# Patient Record
Sex: Female | Born: 1985 | Race: White | Hispanic: No | Smoking: Current every day smoker
Health system: Southern US, Community
[De-identification: ages and names within clinical notes are randomized; demographics above are authoritative.]

---

## 2004-08-22 ENCOUNTER — Emergency Department: Payer: Self-pay | Admitting: Emergency Medicine

## 2004-08-24 ENCOUNTER — Ambulatory Visit: Payer: Self-pay | Admitting: Obstetrics and Gynecology

## 2004-08-27 ENCOUNTER — Emergency Department: Payer: Self-pay | Admitting: Emergency Medicine

## 2004-10-21 ENCOUNTER — Other Ambulatory Visit: Payer: Self-pay

## 2004-10-21 ENCOUNTER — Ambulatory Visit: Payer: Self-pay | Admitting: Specialist

## 2005-04-16 ENCOUNTER — Inpatient Hospital Stay: Payer: Self-pay | Admitting: Obstetrics and Gynecology

## 2006-03-28 ENCOUNTER — Ambulatory Visit: Payer: Self-pay | Admitting: Family Medicine

## 2006-05-11 ENCOUNTER — Ambulatory Visit: Payer: Self-pay | Admitting: Family Medicine

## 2006-09-02 ENCOUNTER — Observation Stay: Payer: Self-pay | Admitting: Obstetrics and Gynecology

## 2006-09-10 ENCOUNTER — Observation Stay: Payer: Self-pay | Admitting: Obstetrics and Gynecology

## 2006-09-22 ENCOUNTER — Inpatient Hospital Stay: Payer: Self-pay | Admitting: Obstetrics and Gynecology

## 2008-06-08 ENCOUNTER — Emergency Department: Payer: Self-pay | Admitting: Emergency Medicine

## 2008-09-25 ENCOUNTER — Ambulatory Visit: Payer: Self-pay

## 2009-05-20 ENCOUNTER — Emergency Department: Payer: Self-pay | Admitting: Emergency Medicine

## 2009-08-16 ENCOUNTER — Ambulatory Visit: Payer: Self-pay | Admitting: Family Medicine

## 2009-11-12 ENCOUNTER — Observation Stay: Payer: Self-pay | Admitting: Obstetrics and Gynecology

## 2009-12-03 ENCOUNTER — Observation Stay: Payer: Self-pay | Admitting: Obstetrics and Gynecology

## 2009-12-28 ENCOUNTER — Inpatient Hospital Stay: Payer: Self-pay

## 2010-08-14 ENCOUNTER — Emergency Department: Payer: Self-pay | Admitting: Emergency Medicine

## 2010-08-29 ENCOUNTER — Ambulatory Visit: Payer: Self-pay | Admitting: Obstetrics and Gynecology

## 2010-09-01 ENCOUNTER — Ambulatory Visit (HOSPITAL_COMMUNITY): Admission: RE | Admit: 2010-09-01 | Discharge: 2010-09-01 | Payer: Self-pay | Admitting: Obstetrics and Gynecology

## 2010-09-15 ENCOUNTER — Other Ambulatory Visit: Admission: RE | Admit: 2010-09-15 | Discharge: 2010-09-15 | Payer: Self-pay | Admitting: Obstetrics & Gynecology

## 2010-09-15 ENCOUNTER — Ambulatory Visit: Payer: Self-pay | Admitting: Obstetrics & Gynecology

## 2010-09-16 ENCOUNTER — Encounter: Payer: Self-pay | Admitting: Obstetrics & Gynecology

## 2010-10-13 ENCOUNTER — Ambulatory Visit: Payer: Self-pay | Admitting: Obstetrics and Gynecology

## 2010-10-31 ENCOUNTER — Encounter: Payer: Self-pay | Admitting: Obstetrics and Gynecology

## 2010-10-31 ENCOUNTER — Ambulatory Visit: Payer: Self-pay | Admitting: Obstetrics and Gynecology

## 2010-10-31 LAB — CONVERTED CEMR LAB
HCT: 33.7 % — ABNORMAL LOW (ref 36.0–46.0)
HIV: NONREACTIVE
Hemoglobin: 11.2 g/dL — ABNORMAL LOW (ref 12.0–15.0)
MCHC: 33.2 g/dL (ref 30.0–36.0)
MCV: 87.5 fL (ref 78.0–100.0)
Platelets: 186 10*3/uL (ref 150–400)
RBC: 3.85 M/uL — ABNORMAL LOW (ref 3.87–5.11)
RDW: 14.3 % (ref 11.5–15.5)
WBC: 10.7 10*3/uL — ABNORMAL HIGH (ref 4.0–10.5)

## 2010-11-19 ENCOUNTER — Inpatient Hospital Stay (HOSPITAL_COMMUNITY)
Admission: AD | Admit: 2010-11-19 | Discharge: 2010-11-20 | Payer: Self-pay | Source: Home / Self Care | Attending: Obstetrics and Gynecology | Admitting: Obstetrics and Gynecology

## 2010-11-21 ENCOUNTER — Ambulatory Visit
Admission: RE | Admit: 2010-11-21 | Discharge: 2010-11-21 | Payer: Self-pay | Source: Home / Self Care | Attending: Obstetrics and Gynecology | Admitting: Obstetrics and Gynecology

## 2010-11-28 LAB — URINALYSIS, ROUTINE W REFLEX MICROSCOPIC
Bilirubin Urine: NEGATIVE
Hgb urine dipstick: NEGATIVE
Ketones, ur: >80 mg/dL — AB
Nitrite: NEGATIVE
Protein, ur: NEGATIVE mg/dL
Specific Gravity, Urine: >1.03 — ABNORMAL HIGH (ref 1.005–1.030)
Urine Glucose, Fasting: NEGATIVE mg/dL
Urobilinogen, UA: 1 mg/dL (ref 0.0–1.0)
pH: 6 (ref 5.0–8.0)

## 2010-12-13 ENCOUNTER — Ambulatory Visit: Admit: 2010-12-13 | Payer: Self-pay | Admitting: Obstetrics & Gynecology

## 2010-12-22 ENCOUNTER — Encounter: Payer: Self-pay | Admitting: Obstetrics & Gynecology

## 2010-12-22 DIAGNOSIS — Z3480 Encounter for supervision of other normal pregnancy, unspecified trimester: Secondary | ICD-10-CM

## 2010-12-29 ENCOUNTER — Encounter: Payer: Medicaid Other | Admitting: Obstetrics & Gynecology

## 2010-12-29 ENCOUNTER — Encounter: Payer: Self-pay | Admitting: Obstetrics and Gynecology

## 2010-12-29 DIAGNOSIS — Z3480 Encounter for supervision of other normal pregnancy, unspecified trimester: Secondary | ICD-10-CM

## 2010-12-29 LAB — CONVERTED CEMR LAB
Chlamydia, DNA Probe: NEGATIVE
GC Probe Amp, Genital: NEGATIVE

## 2011-01-04 ENCOUNTER — Encounter: Payer: Medicaid Other | Admitting: Obstetrics & Gynecology

## 2011-01-04 DIAGNOSIS — Z3480 Encounter for supervision of other normal pregnancy, unspecified trimester: Secondary | ICD-10-CM

## 2011-01-12 ENCOUNTER — Encounter: Payer: Medicaid Other | Admitting: Obstetrics & Gynecology

## 2011-01-12 DIAGNOSIS — Z3480 Encounter for supervision of other normal pregnancy, unspecified trimester: Secondary | ICD-10-CM

## 2011-01-18 ENCOUNTER — Observation Stay: Payer: Self-pay

## 2011-01-19 ENCOUNTER — Encounter: Payer: Medicaid Other | Admitting: Obstetrics & Gynecology

## 2011-01-19 DIAGNOSIS — Z3480 Encounter for supervision of other normal pregnancy, unspecified trimester: Secondary | ICD-10-CM

## 2011-01-24 ENCOUNTER — Inpatient Hospital Stay (HOSPITAL_COMMUNITY)
Admission: AD | Admit: 2011-01-24 | Discharge: 2011-01-26 | DRG: 775 | Disposition: A | Discharge status: Home / Self Care | Payer: Medicaid Other | Source: Ambulatory Visit | Attending: Obstetrics & Gynecology | Admitting: Obstetrics & Gynecology

## 2011-01-24 ENCOUNTER — Encounter: Payer: Medicaid Other | Admitting: Obstetrics and Gynecology

## 2011-01-24 LAB — CBC
HCT: 34.2 % — ABNORMAL LOW (ref 36.0–46.0)
Hemoglobin: 11.2 g/dL — ABNORMAL LOW (ref 12.0–15.0)
MCH: 28.3 pg (ref 26.0–34.0)
MCHC: 32.7 g/dL (ref 30.0–36.0)
MCV: 86.4 fL (ref 78.0–100.0)
Platelets: 168 10*3/uL (ref 150–400)
RBC: 3.96 MIL/uL (ref 3.87–5.11)
RDW: 14.7 % (ref 11.5–15.5)
WBC: 13.5 10*3/uL — ABNORMAL HIGH (ref 4.0–10.5)

## 2011-01-24 LAB — RPR: RPR Ser Ql: NONREACTIVE

## 2011-01-24 LAB — TYPE AND SCREEN
ABO/RH(D): O POS
Antibody Screen: NEGATIVE

## 2011-01-24 LAB — RUBELLA SCREEN: Rubella: 55.7 IU/mL — ABNORMAL HIGH

## 2011-01-24 LAB — ABO/RH: ABO/RH(D): O POS

## 2011-01-25 LAB — HEPATITIS B SURFACE ANTIGEN: Hepatitis B Surface Ag: NEGATIVE

## 2011-03-07 ENCOUNTER — Ambulatory Visit: Payer: Medicaid Other | Admitting: Obstetrics and Gynecology

## 2012-10-01 ENCOUNTER — Emergency Department: Payer: Self-pay | Admitting: Emergency Medicine

## 2012-10-01 LAB — COMPREHENSIVE METABOLIC PANEL
Albumin: 3.1 g/dL — ABNORMAL LOW (ref 3.4–5.0)
Alkaline Phosphatase: 48 U/L — ABNORMAL LOW (ref 50–136)
Anion Gap: 7 (ref 7–16)
BUN: 6 mg/dL — ABNORMAL LOW (ref 7–18)
Bilirubin,Total: 0.5 mg/dL (ref 0.2–1.0)
Calcium, Total: 8.5 mg/dL (ref 8.5–10.1)
Chloride: 107 mmol/L (ref 98–107)
Co2: 22 mmol/L (ref 21–32)
Creatinine: 0.75 mg/dL (ref 0.60–1.30)
EGFR (African American): >60
EGFR (Non-African Amer.): >60
Glucose: 93 mg/dL (ref 65–99)
Osmolality: 269 (ref 275–301)
Potassium: 3.9 mmol/L (ref 3.5–5.1)
SGOT(AST): 10 U/L — ABNORMAL LOW (ref 15–37)
SGPT (ALT): 13 U/L (ref 12–78)
Sodium: 136 mmol/L (ref 136–145)
Total Protein: 7.3 g/dL (ref 6.4–8.2)

## 2012-10-01 LAB — URINALYSIS, COMPLETE
Bilirubin,UR: NEGATIVE
Blood: NEGATIVE
Glucose,UR: NEGATIVE mg/dL (ref 0–75)
Nitrite: POSITIVE
Ph: 5 (ref 4.5–8.0)
Protein: 30
RBC,UR: 5 /HPF (ref 0–5)
Specific Gravity: 1.03 (ref 1.003–1.030)
Squamous Epithelial: 1
WBC UR: 22 /HPF (ref 0–5)

## 2012-10-01 LAB — CBC
HCT: 36.6 % (ref 35.0–47.0)
HGB: 12.3 g/dL (ref 12.0–16.0)
MCH: 29.9 pg (ref 26.0–34.0)
MCHC: 33.7 g/dL (ref 32.0–36.0)
MCV: 89 fL (ref 80–100)
Platelet: 230 10*3/uL (ref 150–440)
RBC: 4.12 10*6/uL (ref 3.80–5.20)
RDW: 14.1 % (ref 11.5–14.5)
WBC: 11.3 10*3/uL — ABNORMAL HIGH (ref 3.6–11.0)

## 2012-10-01 LAB — HCG, QUANTITATIVE, PREGNANCY: Beta Hcg, Quant.: 19967 m[IU]/mL — ABNORMAL HIGH

## 2012-10-01 LAB — LIPASE, BLOOD: Lipase: 82 U/L (ref 73–393)

## 2012-10-01 LAB — PREGNANCY, URINE: Pregnancy Test, Urine: NEGATIVE m[IU]/mL

## 2012-10-01 LAB — WET PREP, GENITAL

## 2012-10-03 LAB — URINE CULTURE

## 2013-01-02 ENCOUNTER — Observation Stay: Payer: Self-pay | Admitting: Obstetrics and Gynecology

## 2013-01-02 LAB — CBC WITH DIFFERENTIAL/PLATELET
Basophil #: 0.1 10*3/uL (ref 0.0–0.1)
Basophil %: 0.7 %
Eosinophil #: 0.1 10*3/uL (ref 0.0–0.7)
Eosinophil %: 1.5 %
HCT: 32.4 % — ABNORMAL LOW (ref 35.0–47.0)
HGB: 10.7 g/dL — ABNORMAL LOW (ref 12.0–16.0)
Lymphocyte #: 1 10*3/uL (ref 1.0–3.6)
Lymphocyte %: 11.1 %
MCH: 29.2 pg (ref 26.0–34.0)
MCHC: 33.1 g/dL (ref 32.0–36.0)
MCV: 88 fL (ref 80–100)
Monocyte #: 0.5 x10 3/mm (ref 0.2–0.9)
Monocyte %: 4.9 %
Neutrophil #: 7.6 10*3/uL — ABNORMAL HIGH (ref 1.4–6.5)
Neutrophil %: 81.8 %
Platelet: 189 10*3/uL (ref 150–440)
RBC: 3.67 10*6/uL — ABNORMAL LOW (ref 3.80–5.20)
RDW: 13.7 % (ref 11.5–14.5)
WBC: 9.3 10*3/uL (ref 3.6–11.0)

## 2013-01-02 LAB — BASIC METABOLIC PANEL
Anion Gap: 9 (ref 7–16)
BUN: 7 mg/dL (ref 7–18)
Calcium, Total: 8.3 mg/dL — ABNORMAL LOW (ref 8.5–10.1)
Chloride: 108 mmol/L — ABNORMAL HIGH (ref 98–107)
Co2: 20 mmol/L — ABNORMAL LOW (ref 21–32)
Creatinine: 0.52 mg/dL — ABNORMAL LOW (ref 0.60–1.30)
EGFR (African American): >60
EGFR (Non-African Amer.): >60
Glucose: 79 mg/dL (ref 65–99)
Osmolality: 271 (ref 275–301)
Potassium: 3.7 mmol/L (ref 3.5–5.1)
Sodium: 137 mmol/L (ref 136–145)

## 2013-01-02 LAB — URINALYSIS, COMPLETE
Bacteria: NONE SEEN
Bilirubin,UR: NEGATIVE
Blood: NEGATIVE
Glucose,UR: NEGATIVE mg/dL (ref 0–75)
Ketone: NEGATIVE
Nitrite: NEGATIVE
Ph: 6 (ref 4.5–8.0)
Protein: 30
RBC,UR: 8 /HPF (ref 0–5)
Specific Gravity: 1.024 (ref 1.003–1.030)
Squamous Epithelial: 4
WBC UR: 30 /HPF (ref 0–5)

## 2013-01-02 LAB — DRUG SCREEN, URINE
Amphetamines, Ur Screen: NEGATIVE (ref ?–1000)
Barbiturates, Ur Screen: NEGATIVE (ref ?–200)
Benzodiazepine, Ur Scrn: NEGATIVE (ref ?–200)
Cannabinoid 50 Ng, Ur ~~LOC~~: POSITIVE (ref ?–50)
Cocaine Metabolite,Ur ~~LOC~~: POSITIVE (ref ?–300)
MDMA (Ecstasy)Ur Screen: NEGATIVE (ref ?–500)
Methadone, Ur Screen: NEGATIVE (ref ?–300)
Opiate, Ur Screen: NEGATIVE (ref ?–300)
Phencyclidine (PCP) Ur S: NEGATIVE (ref ?–25)
Tricyclic, Ur Screen: NEGATIVE (ref ?–1000)

## 2013-01-02 LAB — RAPID HIV-1/2 QL/CONFIRM: HIV-1/2,Rapid Ql: NEGATIVE

## 2013-02-05 ENCOUNTER — Observation Stay: Payer: Self-pay | Admitting: Obstetrics and Gynecology

## 2013-02-10 ENCOUNTER — Encounter: Payer: Self-pay | Admitting: Maternal & Fetal Medicine

## 2013-02-15 ENCOUNTER — Observation Stay: Payer: Self-pay

## 2013-02-15 LAB — DRUG SCREEN, URINE
Amphetamines, Ur Screen: NEGATIVE (ref ?–1000)
Barbiturates, Ur Screen: NEGATIVE (ref ?–200)
Benzodiazepine, Ur Scrn: NEGATIVE (ref ?–200)
Cannabinoid 50 Ng, Ur ~~LOC~~: POSITIVE (ref ?–50)
Cocaine Metabolite,Ur ~~LOC~~: NEGATIVE (ref ?–300)
MDMA (Ecstasy)Ur Screen: NEGATIVE (ref ?–500)
Methadone, Ur Screen: NEGATIVE (ref ?–300)
Opiate, Ur Screen: NEGATIVE (ref ?–300)
Phencyclidine (PCP) Ur S: NEGATIVE (ref ?–25)
Tricyclic, Ur Screen: NEGATIVE (ref ?–1000)

## 2013-02-15 LAB — URINALYSIS, COMPLETE
Bilirubin,UR: NEGATIVE
Blood: NEGATIVE
Glucose,UR: NEGATIVE mg/dL (ref 0–75)
Nitrite: NEGATIVE
Ph: 6 (ref 4.5–8.0)
Protein: NEGATIVE
RBC,UR: 5 /HPF (ref 0–5)
Specific Gravity: 1.021 (ref 1.003–1.030)
Squamous Epithelial: 1
WBC UR: 11 /HPF (ref 0–5)

## 2013-02-15 LAB — CBC WITH DIFFERENTIAL/PLATELET
Basophil #: 0 10*3/uL (ref 0.0–0.1)
Basophil %: 0.1 %
Eosinophil #: 0.1 10*3/uL (ref 0.0–0.7)
Eosinophil %: 0.9 %
HCT: 34.6 % — ABNORMAL LOW (ref 35.0–47.0)
HGB: 11.5 g/dL — ABNORMAL LOW (ref 12.0–16.0)
Lymphocyte #: 0.7 10*3/uL — ABNORMAL LOW (ref 1.0–3.6)
Lymphocyte %: 7.1 %
MCH: 28.9 pg (ref 26.0–34.0)
MCHC: 33.3 g/dL (ref 32.0–36.0)
MCV: 87 fL (ref 80–100)
Monocyte #: 0.4 x10 3/mm (ref 0.2–0.9)
Monocyte %: 4.6 %
Neutrophil #: 8.2 10*3/uL — ABNORMAL HIGH (ref 1.4–6.5)
Neutrophil %: 87.3 %
Platelet: 162 10*3/uL (ref 150–440)
RBC: 3.98 10*6/uL (ref 3.80–5.20)
RDW: 14.3 % (ref 11.5–14.5)
WBC: 9.4 10*3/uL (ref 3.6–11.0)

## 2013-03-17 ENCOUNTER — Inpatient Hospital Stay: Payer: Self-pay

## 2013-03-17 LAB — DRUG SCREEN, URINE
Amphetamines, Ur Screen: NEGATIVE (ref ?–1000)
Barbiturates, Ur Screen: NEGATIVE (ref ?–200)
Benzodiazepine, Ur Scrn: NEGATIVE (ref ?–200)
Cannabinoid 50 Ng, Ur ~~LOC~~: POSITIVE (ref ?–50)
Cocaine Metabolite,Ur ~~LOC~~: NEGATIVE (ref ?–300)
MDMA (Ecstasy)Ur Screen: NEGATIVE (ref ?–500)
Methadone, Ur Screen: NEGATIVE (ref ?–300)
Opiate, Ur Screen: NEGATIVE (ref ?–300)
Phencyclidine (PCP) Ur S: NEGATIVE (ref ?–25)
Tricyclic, Ur Screen: NEGATIVE (ref ?–1000)

## 2013-03-17 LAB — CBC WITH DIFFERENTIAL/PLATELET
Basophil #: 0 10*3/uL (ref 0.0–0.1)
Basophil %: 0.3 %
Eosinophil #: 0.2 10*3/uL (ref 0.0–0.7)
Eosinophil %: 1.5 %
HCT: 35 % (ref 35.0–47.0)
HGB: 11.5 g/dL — ABNORMAL LOW (ref 12.0–16.0)
Lymphocyte #: 2.1 10*3/uL (ref 1.0–3.6)
Lymphocyte %: 14.5 %
MCH: 28.5 pg (ref 26.0–34.0)
MCHC: 32.9 g/dL (ref 32.0–36.0)
MCV: 87 fL (ref 80–100)
Monocyte #: 0.8 x10 3/mm (ref 0.2–0.9)
Monocyte %: 5.7 %
Neutrophil #: 11.1 10*3/uL — ABNORMAL HIGH (ref 1.4–6.5)
Neutrophil %: 78 %
Platelet: 185 10*3/uL (ref 150–440)
RBC: 4.04 10*6/uL (ref 3.80–5.20)
RDW: 15.6 % — ABNORMAL HIGH (ref 11.5–14.5)
WBC: 14.3 10*3/uL — ABNORMAL HIGH (ref 3.6–11.0)

## 2013-03-18 LAB — HEMATOCRIT: HCT: 32.2 % — ABNORMAL LOW (ref 35.0–47.0)

## 2013-03-19 LAB — PATHOLOGY REPORT

## 2013-09-30 ENCOUNTER — Emergency Department: Payer: Self-pay | Admitting: Emergency Medicine

## 2013-09-30 LAB — URINALYSIS, COMPLETE
Bilirubin,UR: NEGATIVE
Blood: NEGATIVE
Glucose,UR: NEGATIVE mg/dL (ref 0–75)
Ketone: NEGATIVE
Nitrite: NEGATIVE
Ph: 5 (ref 4.5–8.0)
Protein: NEGATIVE
RBC,UR: 2 /HPF (ref 0–5)
Specific Gravity: 1.021 (ref 1.003–1.030)
Squamous Epithelial: 2
WBC UR: 22 /HPF (ref 0–5)

## 2013-09-30 LAB — PREGNANCY, URINE: Pregnancy Test, Urine: NEGATIVE m[IU]/mL

## 2014-03-18 ENCOUNTER — Emergency Department: Payer: Self-pay | Admitting: Emergency Medicine

## 2014-03-18 LAB — CBC WITH DIFFERENTIAL/PLATELET
Basophil #: 0.1 10*3/uL (ref 0.0–0.1)
Basophil %: 0.5 %
Eosinophil #: 0.2 10*3/uL (ref 0.0–0.7)
Eosinophil %: 0.9 %
HCT: 37.3 % (ref 35.0–47.0)
HGB: 11.9 g/dL — ABNORMAL LOW (ref 12.0–16.0)
Lymphocyte #: 1.8 10*3/uL (ref 1.0–3.6)
Lymphocyte %: 9.8 %
MCH: 26.8 pg (ref 26.0–34.0)
MCHC: 31.9 g/dL — ABNORMAL LOW (ref 32.0–36.0)
MCV: 84 fL (ref 80–100)
Monocyte #: 1.1 x10 3/mm — ABNORMAL HIGH (ref 0.2–0.9)
Monocyte %: 5.8 %
Neutrophil #: 15.6 10*3/uL — ABNORMAL HIGH (ref 1.4–6.5)
Neutrophil %: 83 %
Platelet: 316 10*3/uL (ref 150–440)
RBC: 4.45 10*6/uL (ref 3.80–5.20)
RDW: 15.4 % — ABNORMAL HIGH (ref 11.5–14.5)
WBC: 18.8 10*3/uL — ABNORMAL HIGH (ref 3.6–11.0)

## 2014-03-18 LAB — COMPREHENSIVE METABOLIC PANEL
Albumin: 3.7 g/dL (ref 3.4–5.0)
Alkaline Phosphatase: 56 U/L
Anion Gap: 7 (ref 7–16)
BUN: 5 mg/dL — ABNORMAL LOW (ref 7–18)
Bilirubin,Total: 0.7 mg/dL (ref 0.2–1.0)
Calcium, Total: 9.1 mg/dL (ref 8.5–10.1)
Chloride: 107 mmol/L (ref 98–107)
Co2: 24 mmol/L (ref 21–32)
Creatinine: 1.09 mg/dL (ref 0.60–1.30)
EGFR (African American): >60
EGFR (Non-African Amer.): >60
Glucose: 97 mg/dL (ref 65–99)
Osmolality: 273 (ref 275–301)
Potassium: 3.5 mmol/L (ref 3.5–5.1)
SGOT(AST): 19 U/L (ref 15–37)
SGPT (ALT): 16 U/L (ref 12–78)
Sodium: 138 mmol/L (ref 136–145)
Total Protein: 8.6 g/dL — ABNORMAL HIGH (ref 6.4–8.2)

## 2014-03-20 LAB — BETA STREP CULTURE(ARMC)

## 2015-03-05 NOTE — Op Note (Signed)
PATIENT NAME:  Ana AntiguaOCH, [name] MR#:  [id number] DATE OF BIRTH:  [birth year]  DATE OF PROCEDURE:  03/18/2013  PREOPERATIVE DIAGNOSIS: Elective sterilization.  POSTOPERATIVE DIAGNOSIS: Elective sterilization  PROCEDURE: Left tubal ligation.   ANESTHESIA: General endotracheal anesthesia.   SURGEON: Suzy Bouchardhomas J. Schermerhorn, MD  INDICATION: A 29 year old gravida 6, para 5 patient status post spontaneous vaginal delivery. The patient with a previous right salpingectomy for an ectopic. The patient reconfirms the desire to have permanent sterilization.   PROCEDURE: After adequate general endotracheal anesthesia, the patient was placed in the dorsal supine position. The abdomen was prepped and draped in normal sterile fashion. A 15 mm infraumbilical incision was made after injecting with 0.5% Marcaine. The fascia was opened sharply, followed by the peritoneum. Attention was directed to the patient's left side of the uterus. The fallopian tube was grasped with a Babcock clamp and the fimbriated end was visualized. Two separate 0 plain gut sutures were applied to the fallopian tube and a 1.5 cm portion of the fallopian tube removed. Good hemostasis noted. The fascia was then closed with 2-0 Vicryl. The patient did have a small umbilical hernia. Additional fascia was gathered to reinforce this umbilical hernia. Skin was reapproximated with interrupted 4-0 Vicryl suture. Sterile dressing applied. There were no complications. Estimated blood loss minimal. Intraoperative fluids 900 mL. The patient was taken to the recovery room in good condition.   ____________________________ Suzy Bouchardhomas J. Schermerhorn, MD tjs:OSi D: 03/18/2013 11:55:00 ET T: 03/18/2013 12:06:20 ET JOB#: [id number]  cc: Suzy Bouchardhomas J. Schermerhorn, MD, <Dictator> Suzy BouchardHOMAS J SCHERMERHORN MD ELECTRONICALLY SIGNED 03/31/2013 13:55

## 2015-03-23 NOTE — H&P (Signed)
L&D Evaluation:  History:  HPI 27yom [id number] with LMP of 04/27/12 & EDD of 03/12/13 dated by 30 wk US here for labor S/S. Pt has reg UC's becoming more uncomfortable this pm and arrived here in labor with cx of 5-6 cms. BBOW. GBS  unknown so antibiotics are being given. PNC significant for +Cocaine and +MJ. Late entry to care at 33 2/7 weeks with anemnia and UTI hx.   Presents with contractions   Patient's Medical History +MJ,+COCAINE   Patient's Surgical History RT SALPINGECTOMY, tONSILS DRAINED.   Medications Pre Natal Vitamins   Allergies NKDA   Social History drugs  +COCAINE, +MJ   Family History Non-Contributory   ROS:  ROS All systems were reviewed.  HEENT, CNS, GI, GU, Respiratory, CV, Renal and Musculoskeletal systems were found to be normal.   Exam:  Vital Signs stable   General no apparent distress   Mental Status clear   Chest clear   Heart normal sinus rhythm, no murmur/gallop/rubs   Abdomen gravid, non-tender   Estimated Fetal Weight Average for gestational age   Back no CVAT   Mebranes Intact   FHT normal rate with no decels, aCCELS SEEN   Ucx absent   Skin dry   Lymph no lymphadenopathy   Other C/O "YELLOW DC"   Impression:  Impression IUP at 33 weeks with abd pain   Plan:  Plan UA, monitor contractions and for cervical change   Electronic Signatures: Sharee PimpleJones, Caron W (CNM)  (Signed [phone]-May-14 20:12)  Authored: L&D Evaluation   Last Updated: 05-May-14 20:12 by Sharee PimpleJones, Caron W (CNM)

## 2015-03-23 NOTE — H&P (Signed)
L&D Evaluation:  History:  HPI 29 yo G6P4 no prenatal care presents to L+D with 1 day h/o nausea and abd. cramping. Other children with viral infection. [facility] No ROM no leakage of fluid. + dysuria. No diarrhea   Patient's Medical History h/o ectopic   Patient's Surgical History I+D tonsillar abscess, l/s left salpingectomy   Medications Pre Natal Vitamins   Allergies NKDA   Social History + cocaine, + MJ   Family History Non-Contributory   ROS:  ROS All systems were reviewed.  HEENT, CNS, GI, GU, Respiratory, CV, Renal and Musculoskeletal systems were found to be normal.   Exam:  Vital Signs 111/57   General no apparent distress   Mental Status clear   Chest clear   Heart normal sinus rhythm   Abdomen gravid, non-tender   Back no CVAT   Edema no edema   Pelvic closed / 40 % /-3   Mebranes Intact   FHT normal rate with no decels, 130 's + accels, isolated variable   FHT Description reassuring, NST   Fetal Heart Rate 130   Ucx absent   Skin dry   Lymph no lymphadenopathy   Other u/s tonight : 30+1 placenta post( no previa) vtx , afi 17 cm. Ua c/w UTI   Impression:  Impression At risk pregnancy 30 +1 weeks . No prenatal care, + drug use recent. Viral gastroenteritis.   Plan:  Plan IVF , iv zofran, macrobid bid , urine culture, Initiate PNC at [facility]. [facility] for drug use. Marland Kitchen. Pt aware of the dangers of Drug use in pregnancy.   Electronic Signatures: Schermerhorn, Ihor Austinhomas J (MD)  (Signed 20-Feb-14 21:29)  Authored: L&D Evaluation   Last Updated: 20-Feb-14 21:29 by Suzy BouchardSchermerhorn, Thomas J (MD)

## 2015-03-23 NOTE — H&P (Signed)
L&D Evaluation:  History:  HPI 29yo [id number] with LMP of 02/01/13 & EDD of 03/12/13 here for "N,V,D since eating last evening". Abd discomfort has now improved. Pt feels better after IV hydration. PMH + for cocaine abuse, +MJ and pt is in the [facility] program and admits to no cocaine since entry in the [facility]. LATE TO PNC.Recent UTI needing 2nd antibiotic  and pt is to pick up Rx after leaving here. Prescribed by health dept.   Presents with nausea/vomiting   Patient's Medical History Cocaine, Kidney problems,,   Patient's Surgical History Tonsils drained 2004   Medications Pre Natal Vitamins   Allergies NKDA   Social History none   Family History Non-Contributory   ROS:  ROS All systems were reviewed.  HEENT, CNS, GI, GU, Respiratory, CV, Renal and Musculoskeletal systems were found to be normal.   Exam:  Vital Signs stable   General no apparent distress   Mental Status clear   Chest clear   Heart normal sinus rhythm, no murmur/gallop/rubs   Abdomen gravid, non-tender   Estimated Fetal Weight Average for gestational age   Fetal Position vtx   Back no CVAT   Reflexes 1+   Mebranes Intact   FHT normal rate with no decels   Ucx irregular   Skin dry   Lymph no lymphadenopathy   Impression:  Impression N&V   Plan:  Plan DC home on Zofran and rest   Comments FU at [facility] as scheduled.   Electronic Signatures: Sharee PimpleJones, Caron W (CNM)  (Signed 05-Apr-14 10:06)  Authored: L&D Evaluation   Last Updated: 05-Apr-14 10:06 by Sharee PimpleJones, Caron W (CNM)

## 2015-06-28 ENCOUNTER — Emergency Department: Payer: Medicaid Other

## 2015-06-28 ENCOUNTER — Emergency Department
Admission: EM | Admit: 2015-06-28 | Discharge: 2015-06-28 | Disposition: A | Discharge status: Home / Self Care | Payer: Self-pay | Attending: Emergency Medicine | Admitting: Emergency Medicine

## 2015-06-28 ENCOUNTER — Encounter: Payer: Self-pay | Admitting: *Deleted

## 2015-06-28 DIAGNOSIS — R590 Localized enlarged lymph nodes: Secondary | ICD-10-CM | POA: Insufficient documentation

## 2015-06-28 DIAGNOSIS — Z720 Tobacco use: Secondary | ICD-10-CM | POA: Insufficient documentation

## 2015-06-28 DIAGNOSIS — J36 Peritonsillar abscess: Secondary | ICD-10-CM | POA: Insufficient documentation

## 2015-06-28 LAB — CBC WITH DIFFERENTIAL/PLATELET
BASOS PCT: 0 %
Basophils Absolute: 0 10*3/uL (ref 0–0.1)
EOS ABS: 0.2 10*3/uL (ref 0–0.7)
EOS PCT: 2 %
HCT: 36.3 % (ref 35.0–47.0)
HEMOGLOBIN: 11.7 g/dL — AB (ref 12.0–16.0)
LYMPHS ABS: 1.4 10*3/uL (ref 1.0–3.6)
Lymphocytes Relative: 13 %
MCH: 28.8 pg (ref 26.0–34.0)
MCHC: 32.1 g/dL (ref 32.0–36.0)
MCV: 89.6 fL (ref 80.0–100.0)
MONOS PCT: 8 %
Monocytes Absolute: 0.9 10*3/uL (ref 0.2–0.9)
NEUTROS PCT: 77 %
Neutro Abs: 9 10*3/uL — ABNORMAL HIGH (ref 1.4–6.5)
Platelets: 199 10*3/uL (ref 150–440)
RBC: 4.06 MIL/uL (ref 3.80–5.20)
RDW: 15.6 % — AB (ref 11.5–14.5)
WBC: 11.5 10*3/uL — ABNORMAL HIGH (ref 3.6–11.0)

## 2015-06-28 LAB — COMPREHENSIVE METABOLIC PANEL
ALBUMIN: 3.8 g/dL (ref 3.5–5.0)
ALT: 23 U/L (ref 14–54)
ANION GAP: 7 (ref 5–15)
AST: 19 U/L (ref 15–41)
Alkaline Phosphatase: 38 U/L (ref 38–126)
BUN: 7 mg/dL (ref 6–20)
CALCIUM: 8.8 mg/dL — AB (ref 8.9–10.3)
CHLORIDE: 106 mmol/L (ref 101–111)
CO2: 23 mmol/L (ref 22–32)
Creatinine, Ser: 0.87 mg/dL (ref 0.44–1.00)
GFR calc non Af Amer: >60 mL/min (ref >60)
GLUCOSE: 83 mg/dL (ref 65–99)
POTASSIUM: 3.6 mmol/L (ref 3.5–5.1)
SODIUM: 136 mmol/L (ref 135–145)
Total Bilirubin: 0.7 mg/dL (ref 0.3–1.2)
Total Protein: 7.7 g/dL (ref 6.5–8.1)

## 2015-06-28 LAB — MONONUCLEOSIS SCREEN: Mono Screen: NEGATIVE

## 2015-06-28 MED ORDER — SODIUM CHLORIDE 0.9 % IV BOLUS (SEPSIS)
1000.0000 mL | Freq: Once | INTRAVENOUS | Status: AC
  Administered 2015-06-28: 1000 mL via INTRAVENOUS

## 2015-06-28 MED ORDER — IOHEXOL 300 MG/ML  SOLN
75.0000 mL | Freq: Once | INTRAMUSCULAR | Status: AC | PRN
  Administered 2015-06-28: 75 mL via INTRAVENOUS
  Filled 2015-06-28: qty 75

## 2015-06-28 MED ORDER — ACETAMINOPHEN-CODEINE 120-12 MG/5ML PO SOLN
10.0000 mL | Freq: Once | ORAL | Status: AC
  Administered 2015-06-28: 10 mL via ORAL

## 2015-06-28 MED ORDER — ACETAMINOPHEN-CODEINE 120-12 MG/5ML PO SOLN: 10.0000 mL | ORAL | Status: AC | PRN

## 2015-06-28 MED ORDER — SODIUM CHLORIDE 0.9 % IV SOLN
3.0000 g | Freq: Once | INTRAVENOUS | Status: AC
  Administered 2015-06-28: 3 g via INTRAVENOUS
  Filled 2015-06-28: qty 3

## 2015-06-28 MED ORDER — ACETAMINOPHEN-CODEINE 120-12 MG/5ML PO SOLN
ORAL | Status: AC
  Administered 2015-06-28: 10 mL via ORAL
  Filled 2015-06-28: qty 2

## 2015-06-28 MED ORDER — PREDNISONE 10 MG (21) PO TBPK: ORAL_TABLET | ORAL | Status: AC

## 2015-06-28 MED ORDER — MORPHINE SULFATE 4 MG/ML IJ SOLN
4.0000 mg | Freq: Once | INTRAMUSCULAR | Status: AC
Start: 2015-06-28 — End: 2015-06-28
  Administered 2015-06-28: 4 mg via INTRAVENOUS
  Filled 2015-06-28: qty 1

## 2015-06-28 MED ORDER — MORPHINE SULFATE (PF) 4 MG/ML IV SOLN
4.0000 mg | Freq: Once | INTRAVENOUS | Status: AC
  Administered 2015-06-28: 4 mg via INTRAVENOUS
  Filled 2015-06-28: qty 1

## 2015-06-28 MED ORDER — AMOXICILLIN-POT CLAVULANATE 875-125 MG PO TABS: 1.0000 | ORAL_TABLET | Freq: Two times a day (BID) | ORAL | Status: AC

## 2015-06-28 MED ORDER — DEXAMETHASONE SODIUM PHOSPHATE 10 MG/ML IJ SOLN
10.0000 mg | Freq: Once | INTRAMUSCULAR | Status: AC
  Administered 2015-06-28: 10 mg via INTRAVENOUS
  Filled 2015-06-28: qty 1

## 2015-06-28 MED ORDER — ONDANSETRON HCL 4 MG/2ML IJ SOLN
4.0000 mg | Freq: Once | INTRAMUSCULAR | Status: AC
  Administered 2015-06-28: 4 mg via INTRAVENOUS
  Filled 2015-06-28: qty 2

## 2015-06-28 NOTE — ED Notes (Signed)
C/o knot to left side of neck, has sorethroat

## 2015-06-28 NOTE — ED Notes (Signed)
Pt reports sore throat, left ear pain, pt reports " I feel like my tonsil is infected"

## 2015-06-28 NOTE — Discharge Instructions (Signed)
Call tomorrow to schedule a follow-up with the ENT doctor in 2 weeks. Return to the emergency department immediately if you are unable to swallow or other symptoms worsen. Take the antibiotic and prednisone as prescribed without missing any doses and until finished.

## 2015-06-28 NOTE — ED Provider Notes (Signed)
[facility] Emergency Department Provider Note  ____________________________________________  Time seen: Approximately 2:46 PM  I have reviewed the triage vital signs and the nursing notes.   HISTORY  Chief Complaint Sore Throat    HPI Ana Morton is a 29 y.o. female who presents to the emergency department for a 5 day history of sore throat. She is having a hard time swallowing. She states symptoms are worse on the left side and the pain is now moving into her left ear. She denies fever but complains of having chills. She denies other recent illness. She denies known exposures.   History reviewed. No pertinent past medical history.  There are no active problems to display for this patient.   History reviewed. No pertinent past surgical history.  Current Outpatient Rx  Name  Route  Sig  Dispense  Refill  . acetaminophen-codeine 120-12 MG/5ML solution   Oral   Take 10 mLs by mouth every 4 (four) hours as needed for moderate pain.   120 mL   0   . amoxicillin-clavulanate (AUGMENTIN) 875-125 MG per tablet   Oral   Take 1 tablet by mouth 2 (two) times daily.   20 tablet   0   . predniSONE (STERAPRED UNI-PAK 21 TAB) 10 MG (21) TBPK tablet      Take 6 tablets on day 1 Take 5 tablets on day 2 Take 4 tablets on day 3 Take 3 tablets on day 4 Take 2 tablets on day 5 Take 1 tablet on day 6   21 tablet   0     Allergies Review of patient's allergies indicates no known allergies.  No family history on file.  Social History Social History  Substance Use Topics  . Smoking status: Current Every Day Smoker -- 0.50 packs/day  . Smokeless tobacco: None  . Alcohol Use: Yes    Review of Systems Constitutional:Feverno Eyes: No visual changes. ENT: Sore throat.yes, Difficulty Swallowing yes Respiratory: Denies shortness of breath. Gastrointestinal: No abdominal pain.  No nausea, no vomiting.  No diarrhea.  Genitourinary: Negative for  dysuria. Musculoskeletal:Generalized body aches: yes Skin: Rash: no  Neurological: Negative for headaches, focal weakness or numbness.  10-point ROS otherwise negative.  ____________________________________________   PHYSICAL EXAM:  VITAL SIGNS: ED Triage Vitals  Enc Vitals Group     BP 06/28/15 1314 136/85 mmHg     Pulse Rate 06/28/15 1314 106     Resp 06/28/15 1314 18     Temp 06/28/15 1314 98.9 F (37.2 C)     Temp Source 06/28/15 1314 Oral     SpO2 06/28/15 1314 99 %     Weight 06/28/15 1314 170 lb (77.111 kg)     Height 06/28/15 1314 5\' 2"  (1.575 m)     Head Cir --      Peak Flow --      Pain Score 06/28/15 1314 10     Pain Loc --      Pain Edu? --      Excl. in GC? --     Constitutional: Alert and oriented. Well appearing and in no acute distress. Eyes: Conjunctivae are normal. PERRL. EOMI. Head: Atraumatic. Nose: No congestion/rhinnorhea. Mouth/Throat: Mucous membranes are moist.  Oropharynx erythematous. Tonsillar swelling bilaterally but worse on the left without any uvular shift appreciated. Muffled voice. Neck: No stridor.  Lymphatic: Lymphadenopathy: yes left anterior cervical Cardiovascular: Normal rate, regular rhythm. Good peripheral circulation. Respiratory: Normal respiratory effort. Lungs CTAB. Gastrointestinal: Soft and nontender.  Musculoskeletal: No lower extremity tenderness nor edema.   Neurologic:  Normal speech and language. No gross focal neurologic deficits are appreciated. Speech is normal. No gait instability. Skin:  Skin is warm, dry and intact. No rash noted Psychiatric: Mood and affect are normal.  ____________________________________________   LABS (all labs ordered are listed, but only abnormal results are displayed)  Labs Reviewed  CBC WITH DIFFERENTIAL/PLATELET - Abnormal; Notable for the following:    WBC 11.5 (*)    Hemoglobin 11.7 (*)    RDW 15.6 (*)    Neutro Abs 9.0 (*)    All other components within normal limits   COMPREHENSIVE METABOLIC PANEL - Abnormal; Notable for the following:    Calcium 8.8 (*)    All other components within normal limits  MONONUCLEOSIS SCREEN   ____________________________________________  EKG   ____________________________________________  RADIOLOGY  CT soft tissue neck shows enlargement of the tonsils bilaterally with a 1.6 cm abscess within the left tonsil. ____________________________________________   PROCEDURES  Procedure(s) performed: None  Critical Care performed: No  ____________________________________________   INITIAL IMPRESSION / ASSESSMENT AND PLAN / ED COURSE  Pertinent labs & imaging results that were available during my care of the patient were reviewed by me and considered in my medical decision making (see chart for details).  IV Decadron, morphine, zofran, and Unasyn given in the ER.  Case discussed with Dr. Elenore Rota. He was asked the patient in his office in 2 weeks.  Patient to be discharged home. She was even strict return precautions. Upon disposition she was still able to swallow and drink water. ____________________________________________   FINAL CLINICAL IMPRESSION(S) / ED DIAGNOSES  Final diagnoses:  Tonsillar abscess      Chinita Pester, FNP 06/28/15 1614  Loleta Rose, MD 06/28/15 1620

## 2016-04-25 IMAGING — CT CT NECK W/ CM
2 of 3 series · 7 of 14 positions shown, 8 images · IV contrast (omnipaque)
Comparison: None.

CLINICAL DATA: Sore throat, left ear pain.

EXAM:
CT NECK WITH CONTRAST
TECHNIQUE: Multidetector CT imaging of the neck was performed using the
standard protocol following the bolus administration of intravenous
contrast.
CONTRAST:  75mL OMNIPAQUE IOHEXOL 300 MG/ML  SOLN

[Series 2: axial neck · axial · 0.50mm/px · z∈[+124,+228]mm · 3 of 104 slices shown]
[im 26/104  bone]
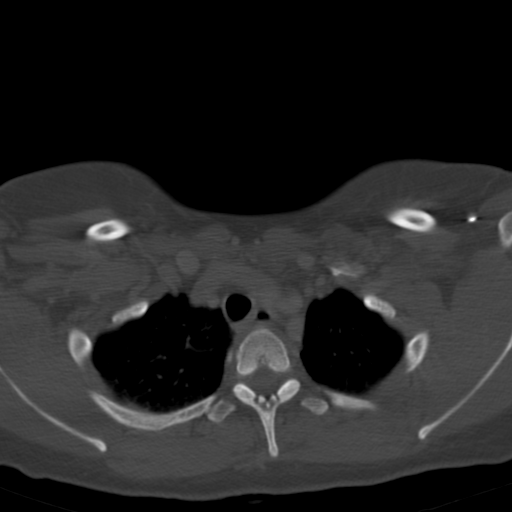
[im 52/104  bone]
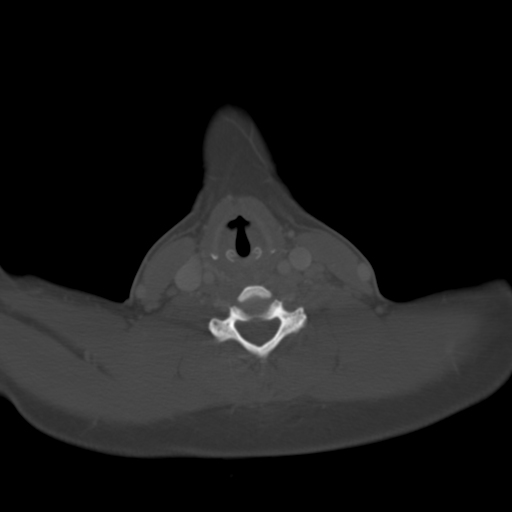
[im 78/104  bone]
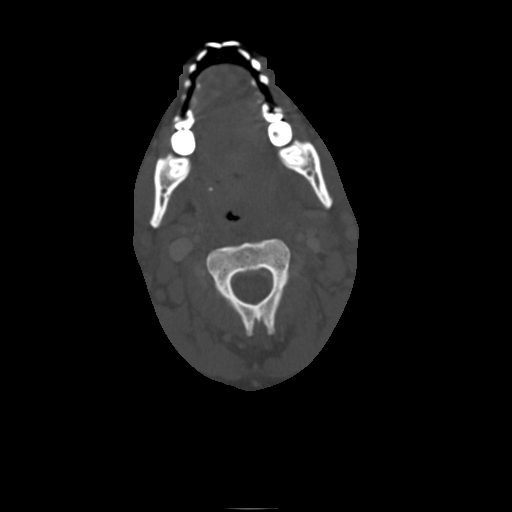

[Series 7: ax oropharynx · axial · 0.46mm/px · z∈[+116,+240]mm · 4 of 104 slices shown, 5 images]
[im 21/104  soft-tissue]
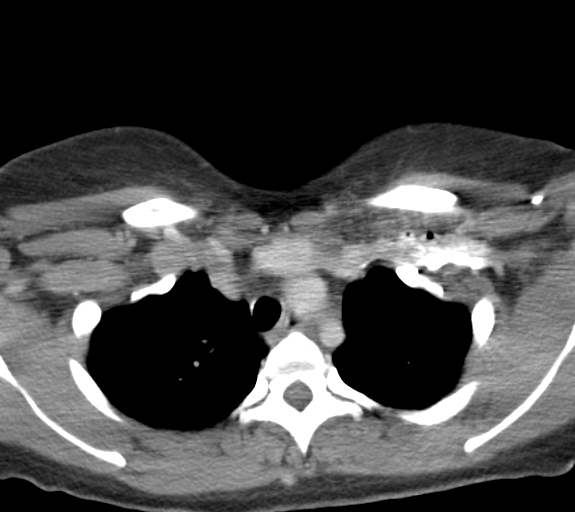
[im 21/104  bone]
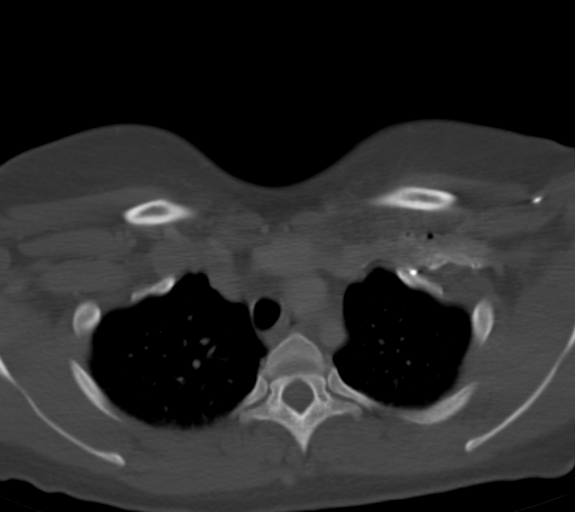
[im 42/104  bone]
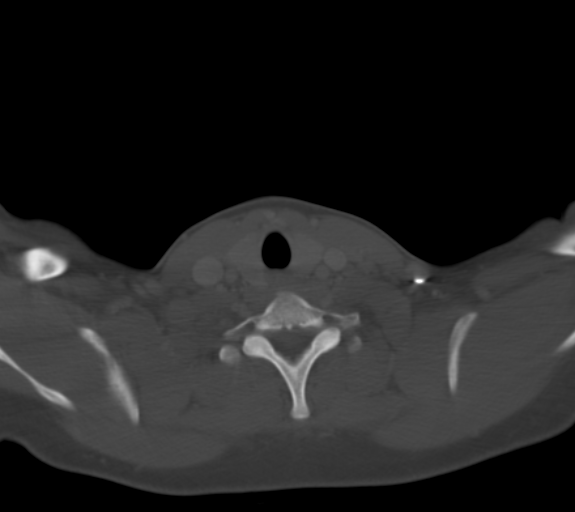
[im 62/104  bone]
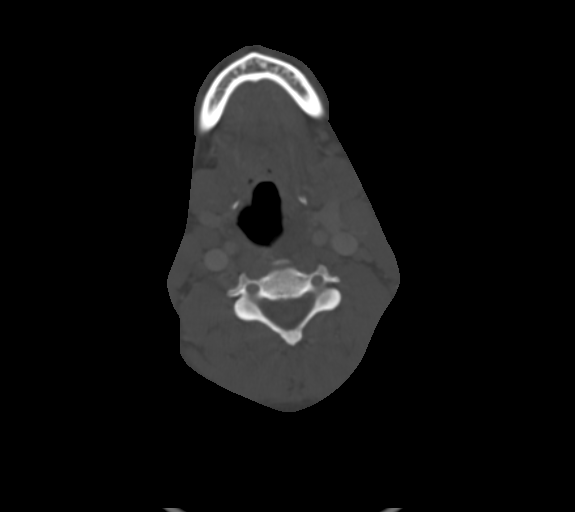
[im 83/104  bone]
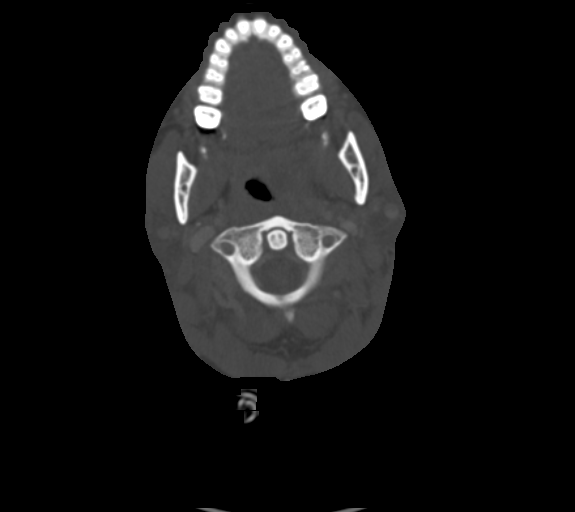

[7 of 14 positions shown; findings below may reference images not displayed]

FINDINGS: Pharynx and larynx: The oropharynx is narrowed due to prominence of
the tonsils bilaterally, left larger than right. In addition, there
is a focal fluid collection within the left tonsil measuring up to
1.6 cm compatible with tonsillar abscess. Soft tissue thickening
continues in the left parapharyngeal soft tissues inferiorly to the
level of the left piriform sinus. Mild retropharyngeal edema noted.

Salivary glands: Normal

Thyroid: Normal

Lymph nodes: Small scattered reactive cervical lymph nodes
bilaterally.

Vascular: Normal

Limited intracranial: Normal

Visualized orbits: Not visualized

Mastoids and visualized paranasal sinuses: Clear

Skeleton: Normal

Upper chest: Upper lung fields clear
IMPRESSION: Enlargement of the tonsils bilaterally, left greater than right with
1.6 cm abscess within the left tonsil. Edema continues in the left
parapharyngeal space and retropharyngeal space.
